# Patient Record
Sex: Female | Born: 2005 | Hispanic: Yes | Marital: Single | Smoking: Never smoker
Health system: Southern US, Community
[De-identification: ages and names within clinical notes are randomized; demographics above are authoritative.]

## PROBLEM LIST (undated history)

## (undated) HISTORY — PX: WISDOM TOOTH EXTRACTION: SHX21

---
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
## 2023-05-23 ENCOUNTER — Encounter: Payer: Self-pay | Admitting: Pediatrics

## 2023-05-23 ENCOUNTER — Other Ambulatory Visit (HOSPITAL_COMMUNITY)
Admission: RE | Admit: 2023-05-23 | Discharge: 2023-05-23 | Disposition: A | Discharge status: Home / Self Care | Payer: Medicaid Other | Source: Ambulatory Visit | Attending: Pediatrics | Admitting: Pediatrics

## 2023-05-23 ENCOUNTER — Ambulatory Visit (INDEPENDENT_AMBULATORY_CARE_PROVIDER_SITE_OTHER): Payer: Medicaid Other | Admitting: Pediatrics

## 2023-05-23 VITALS — BP 119/76 | HR 87 | Ht 58.66 in | Wt 98.8 lb

## 2023-05-23 DIAGNOSIS — Z113 Encounter for screening for infections with a predominantly sexual mode of transmission: Secondary | ICD-10-CM | POA: Diagnosis not present

## 2023-05-23 DIAGNOSIS — Z68.41 Body mass index (BMI) pediatric, 5th percentile to less than 85th percentile for age: Secondary | ICD-10-CM | POA: Diagnosis not present

## 2023-05-23 DIAGNOSIS — Z0000 Encounter for general adult medical examination without abnormal findings: Secondary | ICD-10-CM | POA: Diagnosis not present

## 2023-05-23 DIAGNOSIS — Z130 Encounter for screening for diseases of the blood and blood-forming organs and certain disorders involving the immune mechanism: Secondary | ICD-10-CM

## 2023-05-23 DIAGNOSIS — Z1331 Encounter for screening for depression: Secondary | ICD-10-CM | POA: Diagnosis not present

## 2023-05-23 DIAGNOSIS — Z1339 Encounter for screening examination for other mental health and behavioral disorders: Secondary | ICD-10-CM | POA: Diagnosis not present

## 2023-05-23 DIAGNOSIS — Z973 Presence of spectacles and contact lenses: Secondary | ICD-10-CM

## 2023-05-23 LAB — POCT HEMOGLOBIN: Hemoglobin: 11.4 g/dL (ref 11–14.6)

## 2023-05-23 NOTE — Progress Notes (Unsigned)
Adolescent Well Care Visit Amber Weeks is a 17 y.o. female who is here for well care.     PCP:  Jonetta Osgood, MD   History was provided by the patient and mother.  Confidentiality was discussed with the patient and, if applicable, with caregiver as well. Patient's personal or confidential phone number:    Current Issues: Current concerns include   Doing well  Nutrition: Nutrition/Eating Behaviors: eats variety - mostly at home Adequate calcium in diet?: yes Supplements/ Vitamins: occasional MVI  Exercise/ Media: Play any Sports?:  none Exercise:   walks at school Screen Time:  < 2 hours Media Rules or Monitoring?: yes  Sleep:  Sleep: adequate  Social Screening: Lives with:  parents, siblings Parental relations:  good Activities, Work, and Regulatory affairs officer?: going to school Concerns regarding behavior with peers?  no Stressors of note: no  Education: School Name: 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 Patient is premenarcheal. Menstrual History: no concerns   Patient has a dental home: {yes/no***:64::"yes"}   Confidential social history: Tobacco?  {YES/NO/WILD CARDS:18581} Secondhand smoke exposure?  {YES/NO/WILD WUJWJ:19147} Drugs/ETOH?  {YES/NO/WILD WGNFA:21308}  Sexually Active?  {YES [id number]   Pregnancy Prevention: ***  Safe at home, in school & in relationships?  {Yes or If no, why not?:20788} Safe to self?  {Yes or If no, why not?:20788}   Screenings:  The patient completed the Rapid Assessment for Adolescent Preventive Services screening questionnaire and the following topics were identified as risk factors and discussed: {CHL AMB ASSESSMENT TOPICS:21012045}  In addition, the following topics were discussed as part of anticipatory guidance {CHL AMB ASSESSMENT TOPICS:21012045}.  PHQ-9 completed and results indicated  ***  Physical Exam:  Vitals:   05/23/23 1112  BP: 119/76  Pulse: 87  Weight: 98 lb 12.8 oz (44.8 kg)  Height: 4' 10.66" (1.49 m)   BP 119/76   Pulse 87   Ht 4' 10.66" (1.49 m)   Wt 98 lb 12.8 oz (44.8 kg)   BMI 20.19 kg/m  Body mass index: body mass index is 20.19 kg/m. Blood pressure %iles are not available for patients who are 18 years or older.  Hearing Screening   500Hz  1000Hz  2000Hz  4000Hz   Right ear 20 20 20 20   Left ear 20 20 20 20    Vision Screening   Right eye Left eye Both eyes  Without correction     With correction 20/20 20/20 20/20     Physical Exam   Assessment and Plan:   ***  BMI {ACTION; IS/IS MVH:84696295} appropriate for age  Hearing screening result:{normal/abnormal/not examined:14677} Vision screening result: {normal/abnormal/not examined:14677}  Counseling provided for {CHL AMB PED VACCINE COUNSELING:210130100} vaccine components  Orders Placed This Encounter  Procedures  . POCT urine pregnancy     No follow-ups on file.Dory Peru, MD

## 2023-05-26 LAB — URINE CYTOLOGY ANCILLARY ONLY
Bacterial Vaginitis-Urine: NEGATIVE
Candida Urine: NEGATIVE
Chlamydia: NEGATIVE
Comment: NEGATIVE
Comment: NEGATIVE
Comment: NORMAL
Neisseria Gonorrhea: NEGATIVE
Trichomonas: NEGATIVE

## 2024-07-27 ENCOUNTER — Other Ambulatory Visit: Payer: Self-pay

## 2024-07-27 ENCOUNTER — Ambulatory Visit

## 2024-07-27 ENCOUNTER — Other Ambulatory Visit (HOSPITAL_COMMUNITY)
Admission: RE | Admit: 2024-07-27 | Discharge: 2024-07-27 | Disposition: A | Discharge status: Home / Self Care | Source: Ambulatory Visit | Attending: Pediatrics | Admitting: Pediatrics

## 2024-07-27 VITALS — BP 114/73 | HR 99 | Ht 59.06 in | Wt 107.6 lb

## 2024-07-27 DIAGNOSIS — F411 Generalized anxiety disorder: Secondary | ICD-10-CM

## 2024-07-27 DIAGNOSIS — Z1331 Encounter for screening for depression: Secondary | ICD-10-CM | POA: Diagnosis not present

## 2024-07-27 DIAGNOSIS — Z113 Encounter for screening for infections with a predominantly sexual mode of transmission: Secondary | ICD-10-CM | POA: Insufficient documentation

## 2024-07-27 DIAGNOSIS — Z114 Encounter for screening for human immunodeficiency virus [HIV]: Secondary | ICD-10-CM | POA: Diagnosis not present

## 2024-07-27 DIAGNOSIS — L639 Alopecia areata, unspecified: Secondary | ICD-10-CM

## 2024-07-27 DIAGNOSIS — D649 Anemia, unspecified: Secondary | ICD-10-CM | POA: Diagnosis not present

## 2024-07-27 DIAGNOSIS — Z0001 Encounter for general adult medical examination with abnormal findings: Secondary | ICD-10-CM

## 2024-07-27 DIAGNOSIS — Z1339 Encounter for screening examination for other mental health and behavioral disorders: Secondary | ICD-10-CM | POA: Diagnosis not present

## 2024-07-27 DIAGNOSIS — Z23 Encounter for immunization: Secondary | ICD-10-CM

## 2024-07-27 DIAGNOSIS — Z0000 Encounter for general adult medical examination without abnormal findings: Secondary | ICD-10-CM

## 2024-07-27 LAB — POCT RAPID HIV: Rapid HIV, POC: NEGATIVE

## 2024-07-27 LAB — POCT HEMOGLOBIN: Hemoglobin: 9.6 g/dL — AB (ref 11–14.6)

## 2024-07-27 MED ORDER — IRON 325 (65 FE) MG PO TABS
325.0000 mg | ORAL_TABLET | Freq: Every day | ORAL | 2 refills | Status: AC
  Filled 2024-07-27: qty 30, 30d supply, fill #0

## 2024-07-27 NOTE — Patient Instructions (Addendum)
 Take 1 iron  tablet a day with meals.

## 2024-07-27 NOTE — Progress Notes (Signed)
 Adolescent Well Care Visit Amber Weeks is a 19 y.o. female who is here for well care.     PCP:  Delores Clapper, MD   History was provided by the mother.  Confidentiality was discussed with the patient and, if applicable, with caregiver as well. Patient's personal or confidential phone number: [phone]   Current Issues: Current concerns include: - was seeing GI for gastritis and acid reflux--no longer sees GI, however she has it under control now, its worse with anxiety - anxiety is better controlled now since being in college  - alopecia is the same--taking a hair growth vitamin - more difficulty focusing on school work - has been having more intrusive and disruptive thoughts  Nutrition: Nutrition/Eating Behaviors: 3 meals/day, well-balanced Adequate calcium  in diet?: yogurt, cheese Supplements/ Vitamins: hair growth vitamin   Exercise/ Media: Play any Sports?:  none Exercise:  pilates every day Screen Time:  > 2 hours-counseling provided Media Rules or Monitoring?: no  Sleep:  Sleep: 7-8 hours each night,   Social Screening: Lives with:  at home with parents Parental relations:  good Activities, Work, and Regulatory Affairs Officer?: n/a in college Concerns regarding behavior with peers?  no Stressors of note: no  Education: School Name: [employer] Grade: Probation Officer: doing well; no concerns; studying biology, plans to go to dental school School Behavior: doing well; no concerns except  more difficulty focusing lately  Menstruation:   No LMP recorded. Patient is premenarcheal.  LMP 07/10/24 Menstrual History: menarche ~1 year ago, every 4-5 weeks  Patient has a dental home: no - prior adult dentist retired and cannot find a education officer, community that covers insurance   Confidential social history: Tobacco?  no Secondhand smoke exposure?  no Drugs/ETOH?  no  Sexually Active?  no   Pregnancy Prevention: n/a  Safe at home, in school & in relationships?   Yes Safe to self?  Yes   Screenings:  The patient completed the Rapid Assessment for Adolescent Preventive Services screening questionnaire and the following topics were identified as risk factors and discussed: none  In addition, the following topics were discussed as part of anticipatory guidance healthy eating, exercise, and mental health issues.  PHQ-9 completed and results indicated trouble concentrating. Score = 5  Physical Exam:  Vitals:   07/27/24 0956  BP: 114/73  Pulse: 99  Weight: 107 lb 9.6 oz (48.8 kg)  Height: 4' 11.06 (1.5 m)   BP 114/73 (BP Location: Left Arm, Patient Position: Sitting, Cuff Size: Normal)   Pulse 99   Ht 4' 11.06 (1.5 m)   Wt 107 lb 9.6 oz (48.8 kg)   BMI 21.69 kg/m  Body mass index: body mass index is 21.69 kg/m. Growth %ile SmartLinks can only be used for patients less than 53 years old.  Hearing Screening   500Hz  1000Hz  2000Hz  4000Hz   Right ear 20 20 20 20   Left ear 20 20 20 20    Vision Screening   Right eye Left eye Both eyes  Without correction     With correction 20/20 20/20 20/20     General: Alert, well-appearing in NAD HEENT: Normocephalic, no signs of head trauma.  Eyes: PERRL. EOM intact. Sclerae are anicteric.  Ears: TMs normal. Ear canals normal.  Mouth: Moist mucous membranes. Oropharynx clear with no erythema or exudate Neck: Supple, no meningismus Lymph: No LAD Cardiovascular: Regular rate and rhythm, S1 and S2 normal. No murmur, rub, or gallop appreciated Pulmonary: Normal work of breathing. Clear to auscultation bilaterally with no  wheezes or crackles present Abdomen: Soft, non-tender, non-distended MSK: Spontaneously moving all extremities Neurologic: Grossly intact with normal strength and tone Psych: Mood and affect are appropriate    Assessment and Plan:   18y F with history of gastritis, reflux, anxiety, alopecia here for WCC.  BMI is appropriate for age  Hearing screening result:normal Vision  screening result: normal  Counseling provided for all of the vaccine components  Orders Placed This Encounter  Procedures   Ambulatory referral to Dermatology   POCT Rapid HIV   POCT hemoglobin   1. Routine screening for STI (sexually transmitted infection) (Primary) - POCT Rapid HIV - negative  2. Screening examination for venereal disease - Urine cytology ancillary only  3. Need for vaccination - Unable to give Flu in office today due to pt age, advised to get Flu shot at local pharmacy  4. Encounter for general adult medical examination without abnormal findings - POCT hemoglobin = 9.6  5. Alopecia areata - Ambulatory referral to Dermatology  6. Anxiety state - Recommend she see school counselor to address anxiety and new focusing difficulties - Can refer to psychology or psychiatry in the future if she would like formal ADHD or OCD testing  7. Anemia, unspecified type - POC Hgb = 9.6 - Ferrous Sulfate  (IRON ) 325 (65 Fe) MG TABS; Take 1 tablet (325 mg total) by mouth daily with breakfast.  Dispense: 30 tablet; Refill: 2   - Follow-up in one month to re-check Hgb  Return in about 4 weeks (around 08/24/2024) for Anemia re-check w/ Dr. Delores or Dr. rayford.SABRA Flint Rayford, MD

## 2024-07-28 LAB — URINE CYTOLOGY ANCILLARY ONLY
Chlamydia: NEGATIVE
Comment: NEGATIVE
Comment: NORMAL
Neisseria Gonorrhea: NEGATIVE

## 2024-08-27 ENCOUNTER — Ambulatory Visit: Payer: Self-pay | Admitting: Pediatrics

## 2024-08-27 VITALS — Wt 109.2 lb

## 2024-08-27 DIAGNOSIS — D649 Anemia, unspecified: Secondary | ICD-10-CM

## 2024-08-27 DIAGNOSIS — Z130 Encounter for screening for diseases of the blood and blood-forming organs and certain disorders involving the immune mechanism: Secondary | ICD-10-CM

## 2024-08-27 DIAGNOSIS — L639 Alopecia areata, unspecified: Secondary | ICD-10-CM

## 2024-08-27 LAB — POCT HEMOGLOBIN: Hemoglobin: 11.2 g/dL (ref 11–14.6)

## 2024-08-27 NOTE — Progress Notes (Signed)
" °  Subjective:    Amber Weeks is a 19 y.o. old female here for Follow-up .    HPI  Here to follow up anemia  Has been taking iron  supplemenation Feeling well  No light headedness No other concerns  Had asked for derm referral for hair thinning Initially was patches, hair growing back but just thin throughout [facility] dermatology declined the referral  Review of Systems     Objective:    Wt 109 lb 3.2 oz (49.5 kg)   BMI 22.01 kg/m  Physical Exam Constitutional:      Appearance: Normal appearance.  Cardiovascular:     Rate and Rhythm: Normal rate and regular rhythm.  Pulmonary:     Effort: Pulmonary effort is normal.     Breath sounds: Normal breath sounds.  Neurological:     Mental Status: She is alert.        Assessment and Plan:     [name] was seen today for Follow-up .   Problem List Items Addressed This Visit     Alopecia areata   Anemia - Primary   Other Visit Diagnoses       Screening for iron  deficiency anemia       Relevant Orders   POCT hemoglobin (Completed)       Anemia - has improved with iron  supplementation - continue until rx runs out and then switch to women's MVI with iron .   Reviewed derm referral with Carline - if she is able to find a clinic that will take her and accepts her insurance, let us  know and I will place a referral  PRN follow up  No follow-ups on file.  Abigail JONELLE Daring, MD         "
